# Patient Record
Sex: Female | Born: 1995 | Race: White | Hispanic: No | Marital: Single | Smoking: Never smoker
Health system: Southern US, Community
[De-identification: ages and names within clinical notes are randomized; demographics above are authoritative.]

## PROBLEM LIST (undated history)

## (undated) DIAGNOSIS — L409 Psoriasis, unspecified: Secondary | ICD-10-CM

---

## 2018-09-06 ENCOUNTER — Emergency Department
Admission: EM | Admit: 2018-09-06 | Discharge: 2018-09-06 | Disposition: A | Discharge status: Home / Self Care | Payer: BLUE CROSS/BLUE SHIELD | Source: Home / Self Care | Attending: Family Medicine | Admitting: Family Medicine

## 2018-09-06 ENCOUNTER — Encounter: Payer: Self-pay | Admitting: Emergency Medicine

## 2018-09-06 ENCOUNTER — Emergency Department (INDEPENDENT_AMBULATORY_CARE_PROVIDER_SITE_OTHER): Payer: BLUE CROSS/BLUE SHIELD

## 2018-09-06 ENCOUNTER — Other Ambulatory Visit (HOSPITAL_COMMUNITY)
Admission: RE | Admit: 2018-09-06 | Discharge: 2018-09-06 | Disposition: A | Discharge status: Home / Self Care | Payer: BLUE CROSS/BLUE SHIELD | Source: Ambulatory Visit | Attending: Family Medicine | Admitting: Family Medicine

## 2018-09-06 DIAGNOSIS — R1030 Lower abdominal pain, unspecified: Secondary | ICD-10-CM | POA: Insufficient documentation

## 2018-09-06 DIAGNOSIS — R1031 Right lower quadrant pain: Secondary | ICD-10-CM

## 2018-09-06 DIAGNOSIS — N9489 Other specified conditions associated with female genital organs and menstrual cycle: Secondary | ICD-10-CM

## 2018-09-06 HISTORY — DX: Psoriasis, unspecified: L40.9

## 2018-09-06 LAB — POCT URINALYSIS DIP (MANUAL ENTRY)
Bilirubin, UA: NEGATIVE
Glucose, UA: NEGATIVE mg/dL
Leukocytes, UA: NEGATIVE
Nitrite, UA: NEGATIVE
Protein Ur, POC: NEGATIVE mg/dL
Spec Grav, UA: 1.02 (ref 1.010–1.025)
Urobilinogen, UA: 0.2 E.U./dL
pH, UA: 7 (ref 5.0–8.0)

## 2018-09-06 LAB — POCT CBC W AUTO DIFF (K'VILLE URGENT CARE)

## 2018-09-06 LAB — POCT URINE PREGNANCY: Preg Test, Ur: NEGATIVE

## 2018-09-06 MED ORDER — IOPAMIDOL (ISOVUE-300) INJECTION 61%
30.0000 mL | Freq: Once | INTRAVENOUS | Status: AC | PRN
  Administered 2018-09-06: 15 mL via ORAL

## 2018-09-06 MED ORDER — CEFTRIAXONE SODIUM 1 G IJ SOLR
1.0000 g | Freq: Once | INTRAMUSCULAR | Status: AC
  Administered 2018-09-06: 1 g via INTRAMUSCULAR

## 2018-09-06 MED ORDER — IOPAMIDOL (ISOVUE-300) INJECTION 61%
100.0000 mL | Freq: Once | INTRAVENOUS | Status: AC | PRN
  Administered 2018-09-06: 100 mL via INTRAVENOUS

## 2018-09-06 MED ORDER — KETOROLAC TROMETHAMINE 60 MG/2ML IM SOLN
60.0000 mg | Freq: Once | INTRAMUSCULAR | Status: AC
  Administered 2018-09-06: 60 mg via INTRAMUSCULAR

## 2018-09-06 MED ORDER — DOXYCYCLINE HYCLATE 100 MG PO CAPS: 100.0000 mg | ORAL_CAPSULE | Freq: Two times a day (BID) | ORAL | 0 refills | Status: DC

## 2018-09-06 MED ORDER — TRAMADOL HCL 50 MG PO TABS: 50.0000 mg | ORAL_TABLET | Freq: Four times a day (QID) | ORAL | 0 refills | Status: DC | PRN

## 2018-09-06 NOTE — ED Provider Notes (Signed)
[facility]    CSN: [id number] Arrival date & time: 09/06/18  1218     History   Chief Complaint Chief Complaint  Patient presents with  . Abdominal Pain    HPI Rose Webb is a 23 y.o. female.   HPI  Rose Webb is a 22 y.o. female presenting to UC with c/o sudden onset lower abdominal pain that is constant, worse with movement, radiating to her back since last night. Pain is now radiating throughout her abdomen.  Dysuria. Pt vomited once last night due to the pain.  She has taken ibuprofen w/o relief.  Denies nausea or diarrhea. Denies hx of UTIs. No hx of abdominal surgeries. No hx of similar pain. She is currently on her menstrual cycle. Her cycle is irregular but denies concern for pregnancy and denies pain with her cycles. No hx of cysts or fibroids. Denies concern for STDs. Denies vaginal symptoms.    Past Medical History:  Diagnosis Date  . Psoriasis     There are no active problems to display for this patient.   History reviewed. No pertinent surgical history.  OB History   None      Home Medications    Prior to Admission medications   Medication Sig Start Date End Date Taking? Authorizing Provider  doxycycline (VIBRAMYCIN) 100 MG capsule Take 1 capsule (100 mg total) by mouth 2 (two) times daily. One po bid x 7 days 09/06/18   Lurene Shadow, PA-C  traMADol (ULTRAM) 50 MG tablet Take 1 tablet (50 mg total) by mouth every 6 (six) hours as needed. 09/06/18   Lurene Shadow, PA-C    Family History History reviewed. No pertinent family history.  Social History Social History   Tobacco Use  . Smoking status: Never Smoker  . Smokeless tobacco: Never Used  Substance Use Topics  . Alcohol use: Yes  . Drug use: Not on file     Allergies   Patient has no allergy information on record.   Review of Systems Review of Systems  Constitutional: Positive for appetite change. Negative for chills, fatigue and fever.  Gastrointestinal:  Positive for abdominal pain and vomiting. Negative for diarrhea and nausea.  Genitourinary: Positive for dysuria. Negative for flank pain, frequency, hematuria and urgency.  Musculoskeletal: Positive for back pain. Negative for myalgias.  Neurological: Negative for dizziness, light-headedness and headaches.     Physical Exam Triage Vital Signs ED Triage Vitals [09/06/18 1255]  Enc Vitals Group     BP 124/82     Pulse Rate (!) 120     Resp      Temp 98.4 F (36.9 C)     Temp Source Oral     SpO2 100 %     Weight      Height      Head Circumference      Peak Flow      Pain Score      Pain Loc      Pain Edu?      Excl. in GC?    No data found.  Updated Vital Signs BP 124/82 (BP Location: Right Arm)   Pulse 89   Temp 98.4 F (36.9 C) (Oral)   Wt 187 lb (84.8 kg)   LMP 09/04/2018 Comment: Negative U-preg Today  SpO2 100%   Visual Acuity Right Eye Distance:   Left Eye Distance:   Bilateral Distance:    Right Eye Near:   Left Eye Near:    Bilateral Near:  Physical Exam  Constitutional: She is oriented to person, place, and time. She appears well-developed and well-nourished.  Pt sitting on exam bed, appears uncomfortable, tearful but cooperative during exam.  HENT:  Head: Normocephalic and atraumatic.  Mouth/Throat: Oropharynx is clear and moist.  Eyes: EOM are normal.  Neck: Normal range of motion.  Cardiovascular: Tachycardia present.  Pulmonary/Chest: Effort normal and breath sounds normal.  Abdominal: Soft. Normal appearance. She exhibits no distension. There is generalized tenderness and tenderness in the right lower quadrant, periumbilical area, suprapubic area and left lower quadrant. There is no CVA tenderness.  Musculoskeletal: Normal range of motion.  Neurological: She is alert and oriented to person, place, and time.  Skin: Skin is warm and dry.  Psychiatric: She has a normal mood and affect. Her behavior is normal.  Nursing note and vitals  reviewed.    UC Treatments / Results  Labs (all labs ordered are listed, but only abnormal results are displayed) Labs Reviewed  POCT URINALYSIS DIP (MANUAL ENTRY) - Abnormal; Notable for the following components:      Result Value   Ketones, POC UA trace (5) (*)    Blood, UA small (*)    All other components within normal limits  COMPLETE METABOLIC PANEL WITH GFR  POCT CBC W AUTO DIFF ([facility])  POCT URINE PREGNANCY  CERVICOVAGINAL ANCILLARY ONLY    EKG None  Radiology Ct Abdomen Pelvis W Contrast  Result Date: 09/06/2018 CLINICAL DATA:  Lower abdominal pain, nausea and diarrhea beginning last night. EXAM: CT ABDOMEN AND PELVIS WITH CONTRAST TECHNIQUE: Multidetector CT imaging of the abdomen and pelvis was performed using the standard protocol following bolus administration of intravenous contrast. CONTRAST:  ISOVUE-300 IOPAMIDOL (ISOVUE-300) INJECTION 61% COMPARISON:  None. FINDINGS: LOWER CHEST: Lung bases are clear. Included heart size is normal. No pericardial effusion. HEPATOBILIARY: Liver and gallbladder are normal. Mild focal fatty infiltration about the falciform ligament. PANCREAS: Normal. SPLEEN: Normal. ADRENALS/URINARY TRACT: Kidneys are orthotopic, demonstrating symmetric enhancement. No nephrolithiasis, hydronephrosis or solid renal masses. The unopacified ureters are normal in course and caliber. Urinary bladder is partially distended and unremarkable. Normal adrenal glands. STOMACH/BOWEL: The stomach, small and large bowel are normal in course and caliber without inflammatory changes. Appendix: Location: RIGHT lower quadrant Diameter: 9 mm Appendicolith: Not present Mucosal hyper-enhancement: Not present Extraluminal gas: Not present Periappendiceal collection: Not present VASCULAR/LYMPHATIC: Aortoiliac vessels are normal in course and caliber. No lymphadenopathy by CT size criteria. REPRODUCTIVE: Normal. OTHER: Trace free fluid in anterior pelvic soft  tissues with inflammatory changes about the RIGHT adnexa, discrete from the appendix. MUSCULOSKELETAL: Minimal subcutaneous gas and fat stranding LEFT gluteal subcutaneous fat, potential injection site. IMPRESSION: 1. Enlarged appendix without secondary findings of acute appendicitis. 2. Inflammatory changes in the RIGHT lower quadrant, findings concerning for PID or other gynecologic etiology. Consider pelvic ultrasound. 3. Acute findings discussed with and reconfirmed by Idamae Coccia on 09/06/2018 at 2:46 pm. Electronically Signed   By: Awilda Metro M.D.   On: 09/06/2018 14:48   US Pelvic Complete With Transvaginal  Result Date: 09/06/2018 CLINICAL DATA:  Acute right lower quadrant abdominal pain. EXAM: TRANSABDOMINAL AND TRANSVAGINAL ULTRASOUND OF PELVIS TECHNIQUE: Both transabdominal and transvaginal ultrasound examinations of the pelvis were performed. Transabdominal technique was performed for global imaging of the pelvis including uterus, ovaries, adnexal regions, and pelvic cul-de-sac. It was necessary to proceed with endovaginal exam following the transabdominal exam to visualize the endometrium and ovaries. COMPARISON:  None FINDINGS: Uterus Measurements: 6.8 x  4.5 x 3.4 cm. No fibroids or other mass visualized. Endometrium Thickness: 10 mm which is within normal limits. No focal abnormality visualized. Right ovary Measurements: 2.8 x 2.4 x 2.2 cm. Normal appearance/no adnexal mass. Left ovary Measurements: 3.4 x 2.4 x 2.4 cm. Normal appearance/no adnexal mass. Other findings Minimal free fluid is noted which most likely is physiologic. Enlarged varices are noted in both adnexal regions suggesting pelvic congestion syndrome. IMPRESSION: Enlarged varices are noted in both adnexal regions suggesting pelvic congestion syndrome. Uterus and ovaries are otherwise unremarkable. Electronically Signed   By: Lupita Raider, M.D.   On: 09/06/2018 15:52    Procedures Procedures (including critical care  time)  Medications Ordered in UC Medications  ketorolac (TORADOL) injection 60 mg (60 mg Intramuscular Given 09/06/18 1305)  cefTRIAXone (ROCEPHIN) injection 1 g (1 g Intramuscular Given 09/06/18 1644)    Initial Impression / Assessment and Plan / UC Course  I have reviewed the triage vital signs and the nursing notes.  Pertinent labs & imaging results that were available during my care of the patient were reviewed by me and considered in my medical decision making (see chart for details).     Diffuse abdominal pain, worse in mid to lower abdomen. CBC- WBC: 16.5, pt appears uncomfortable, tearful and tachycardic. UA: unremarkable Urine preg: negative  Toradol 60mg  IM given for pain Concern for possible appendicitis, vs cholecystitis, diverticulitis, ovarian cysts or fibroid CT abd ordered.   CT results discussed with Dr. Karie Kirks, radiology.  Discussed plan with pt.  Pelvic ultrasound ordered as recommended.   [place] suggestive of pelvic congestion syndrome. Consulted with  [facility] and Dr. Cathren Harsh. Will tx for possible PID with Rocephin 1g IM and doxycycline Encourage f/u tomorrow in UC for recheck of symptoms as pt may have early appendicitis.  Discussed symptoms that warrant emergent care in the ED.    Final Clinical Impressions(s) / UC Diagnoses   Final diagnoses:  Lower abdominal pain  Female pelvic congestion syndrome     Discharge Instructions      Your workup today is concerning for possible early appendicitis vs pelvic inflammatory disease which could be due to a bacterial infection in your uterus.  You were given a shot of antibiotics today as well as prescribed some oral antibiotics to take for 1 week.   It is strongly recommended that you return tomorrow to urgent care or your family doctor in the morning for a recheck of your symptoms. If pain continues to worsen, you develop a fever, nausea/vomiting, please go to the emergency department this evening for  further evaluation/monitoring and possible surgical consult for possible appendicitis.    In the meantime, try to stay well hydrated with clear fluids and stick with a bland diet until you are feeling better.   Tramadol is strong pain medication. While taking, do not drink alcohol, drive, or perform any other activities that requires focus while taking these medications.      ED Prescriptions    Medication Sig Dispense Auth. Provider   doxycycline (VIBRAMYCIN) 100 MG capsule Take 1 capsule (100 mg total) by mouth 2 (two) times daily. One po bid x 7 days 14 capsule Puneet Masoner O, PA-C   traMADol (ULTRAM) 50 MG tablet Take 1 tablet (50 mg total) by mouth every 6 (six) hours as needed. 15 tablet Lurene Shadow, PA-C     Controlled Substance Prescriptions [place] Controlled Substance Registry consulted? Yes, I have consulted the [place] Controlled Substances Registry for  this patient, and feel the risk/benefit ratio today is favorable for proceeding with this prescription for a controlled substance.   Lurene Shadow, [place] 09/06/18 [phone]

## 2018-09-06 NOTE — Discharge Instructions (Signed)
°  Your workup today is concerning for possible early appendicitis vs pelvic inflammatory disease which could be due to a bacterial infection in your uterus.  You were given a shot of antibiotics today as well as prescribed some oral antibiotics to take for 1 week.   It is strongly recommended that you return tomorrow to urgent care or your family doctor in the morning for a recheck of your symptoms. If pain continues to worsen, you develop a fever, nausea/vomiting, please go to the emergency department this evening for further evaluation/monitoring and possible surgical consult for possible appendicitis.    In the meantime, try to stay well hydrated with clear fluids and stick with a bland diet until you are feeling better.   Tramadol is strong pain medication. While taking, do not drink alcohol, drive, or perform any other activities that requires focus while taking these medications.

## 2018-09-06 NOTE — ED Triage Notes (Signed)
Pt c/o abdominal pain that radiates to her lower back. Started suddenly last night. States pain is sharp and constant and worse with movement.

## 2018-09-07 ENCOUNTER — Telehealth: Payer: Self-pay | Admitting: *Deleted

## 2018-09-07 LAB — CERVICOVAGINAL ANCILLARY ONLY
Bacterial vaginitis: NEGATIVE
Candida vaginitis: NEGATIVE
Chlamydia: NEGATIVE
Neisseria Gonorrhea: NEGATIVE
Trichomonas: NEGATIVE

## 2018-09-07 LAB — COMPLETE METABOLIC PANEL WITH GFR
AG Ratio: 1.2 (calc) (ref 1.0–2.5)
ALT: 15 U/L (ref 6–29)
AST: 17 U/L (ref 10–30)
Albumin: 4.2 g/dL (ref 3.6–5.1)
Alkaline phosphatase (APISO): 67 U/L (ref 33–115)
BUN: 8 mg/dL (ref 7–25)
CO2: 24 mmol/L (ref 20–32)
Calcium: 9.3 mg/dL (ref 8.6–10.2)
Chloride: 102 mmol/L (ref 98–110)
Creat: 0.84 mg/dL (ref 0.50–1.10)
GFR, Est African American: 114 mL/min/{1.73_m2} (ref >=60)
GFR, Est Non African American: 99 mL/min/{1.73_m2} (ref >=60)
Globulin: 3.4 g/dL (calc) (ref 1.9–3.7)
Glucose, Bld: 96 mg/dL (ref 65–99)
Potassium: 3.9 mmol/L (ref 3.5–5.3)
Sodium: 136 mmol/L (ref 135–146)
Total Bilirubin: 0.4 mg/dL (ref 0.2–1.2)
Total Protein: 7.6 g/dL (ref 6.1–8.1)

## 2018-09-07 NOTE — Telephone Encounter (Signed)
Spoke to pt given lab results. Call back if you have any questions or concerns. Clemens Catholic, LPN

## 2019-03-19 IMAGING — US US PELVIS COMPLETE TRANSABD/TRANSVAG
1 series · 13 of 25 positions shown · non-contrast
Comparison: None

CLINICAL DATA: Acute right lower quadrant abdominal pain.

EXAM:
TRANSABDOMINAL AND TRANSVAGINAL ULTRASOUND OF PELVIS
TECHNIQUE: Both transabdominal and transvaginal ultrasound examinations of the
pelvis were performed. Transabdominal technique was performed for
global imaging of the pelvis including uterus, ovaries, adnexal
regions, and pelvic cul-de-sac. It was necessary to proceed with
endovaginal exam following the transabdominal exam to visualize the
endometrium and ovaries.

[Series 1: us pelvis complete transabd/transvag · 0.17mm/px · 13 of 71 slices shown]
[im 1/71]
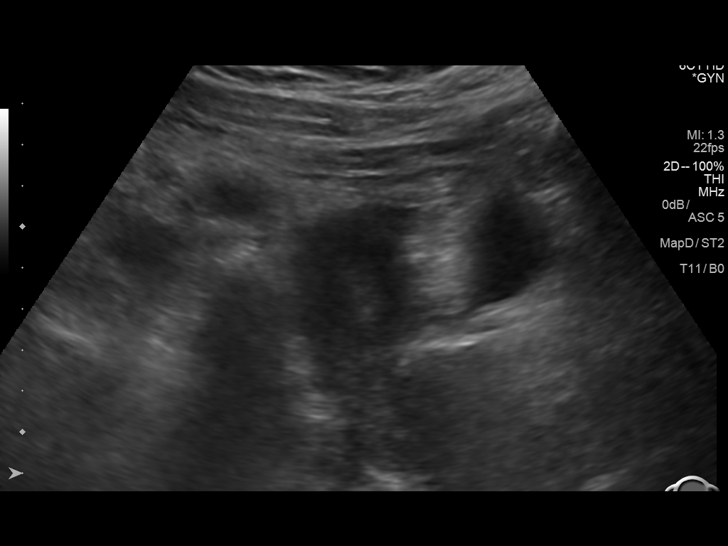
[im 6/71]
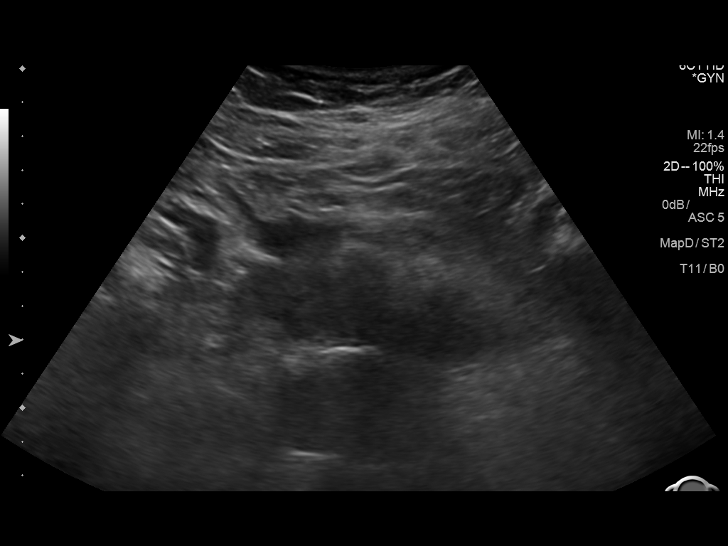
[im 12/71]
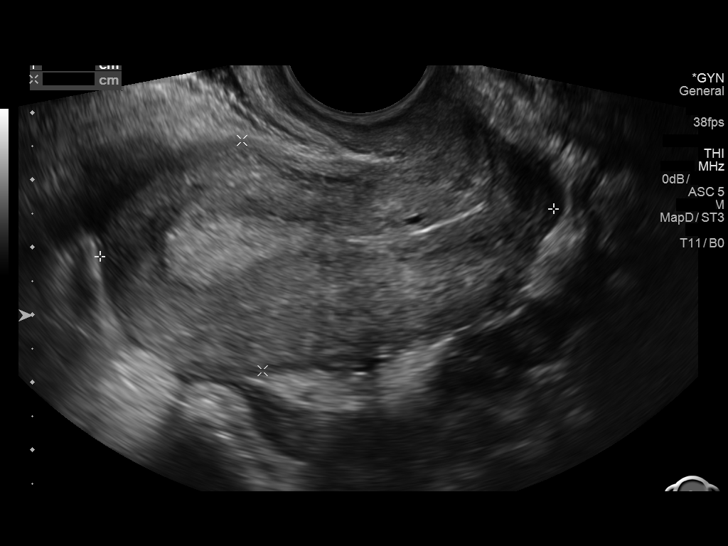
[im 18/71]
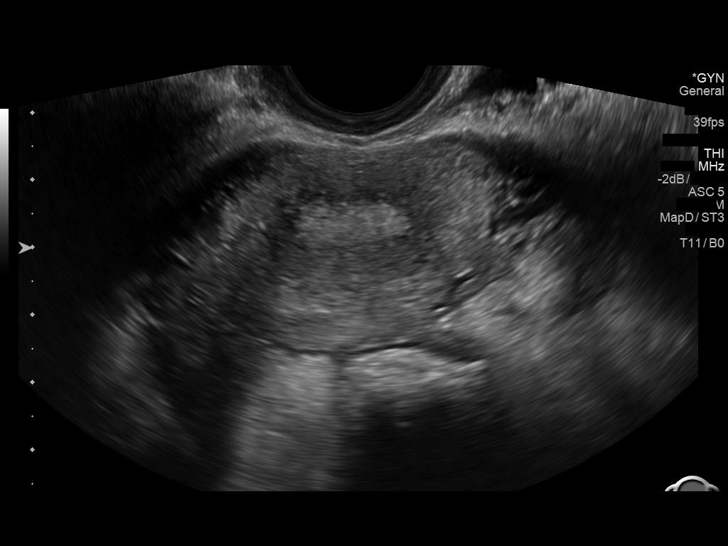
[im 24/71]
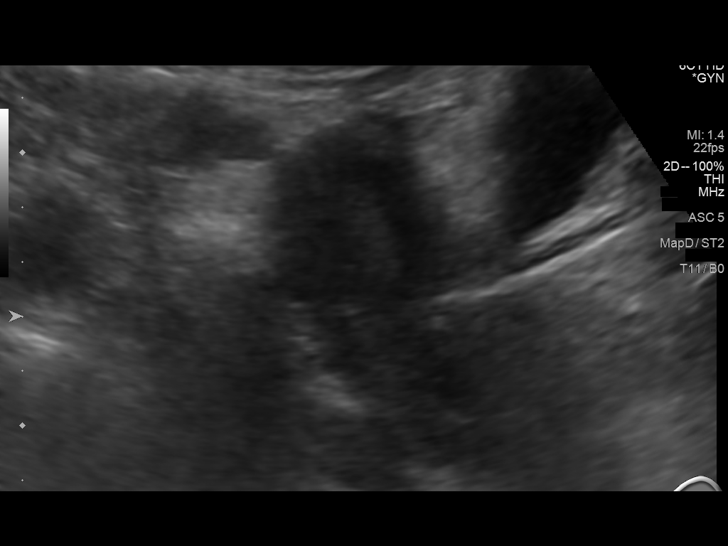
[im 30/71]
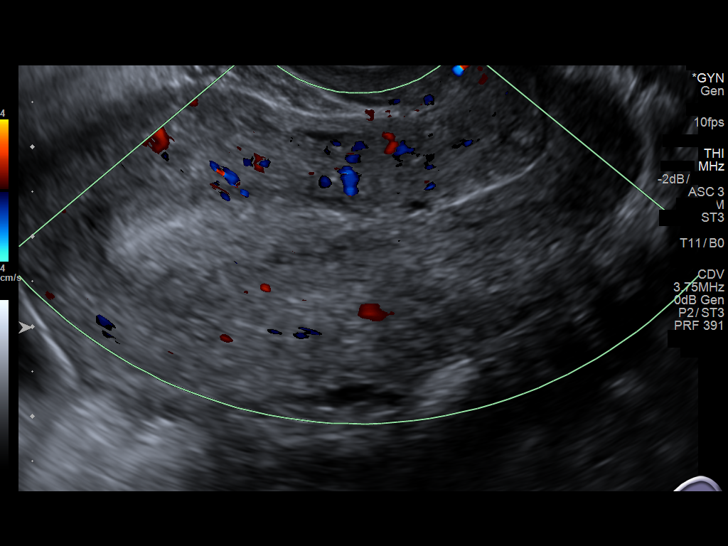
[im 36/71]
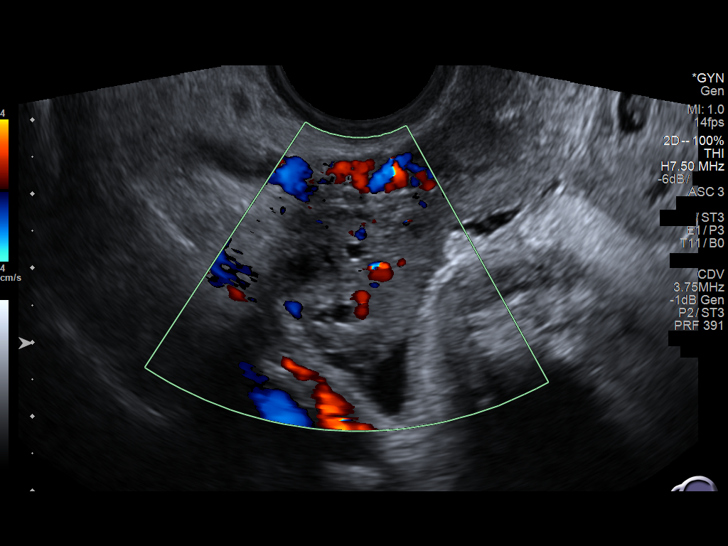
[im 41/71]
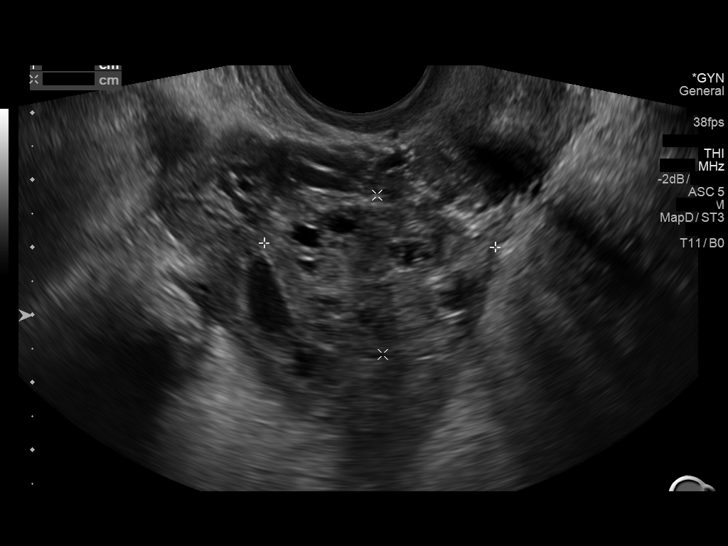
[im 47/71]
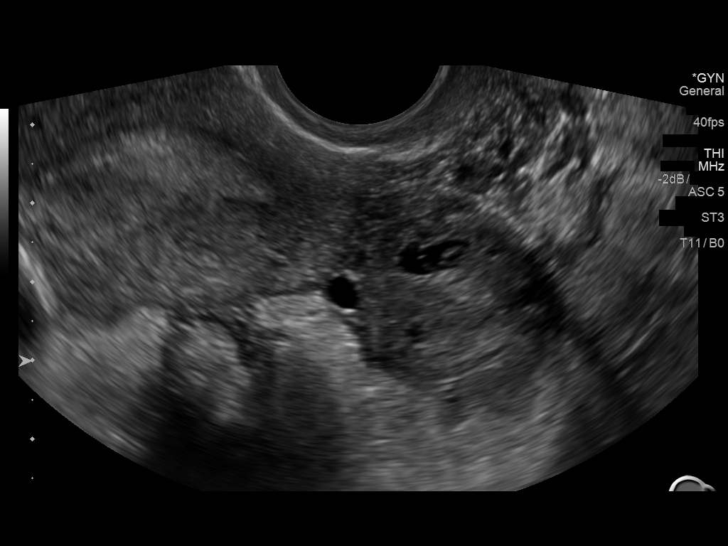
[im 53/71]
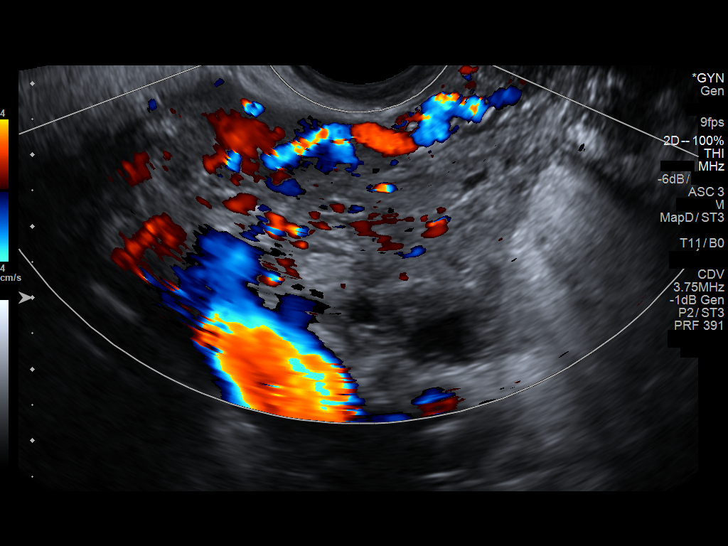
[im 59/71]
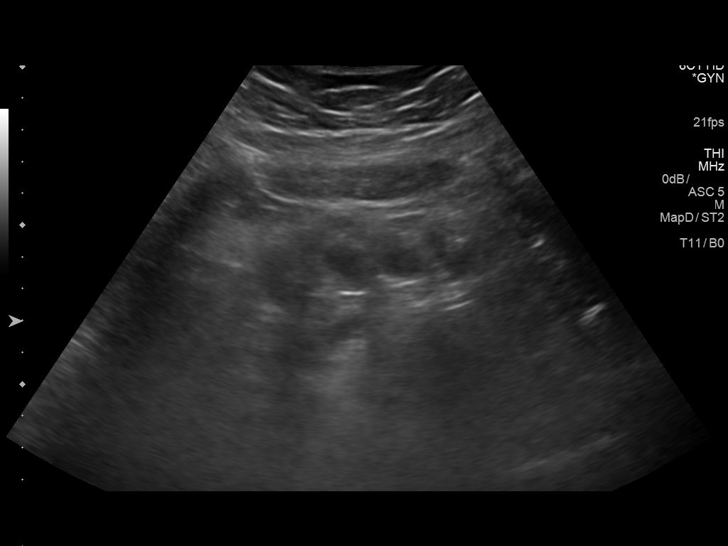
[im 65/71]
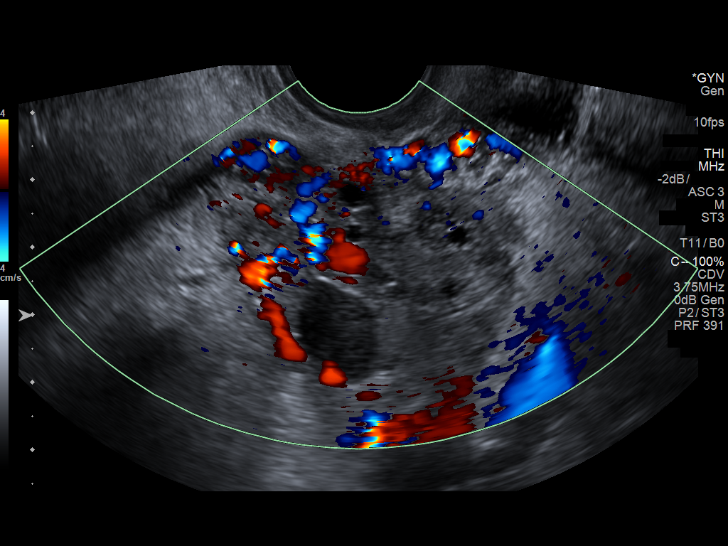
[im 71/71]
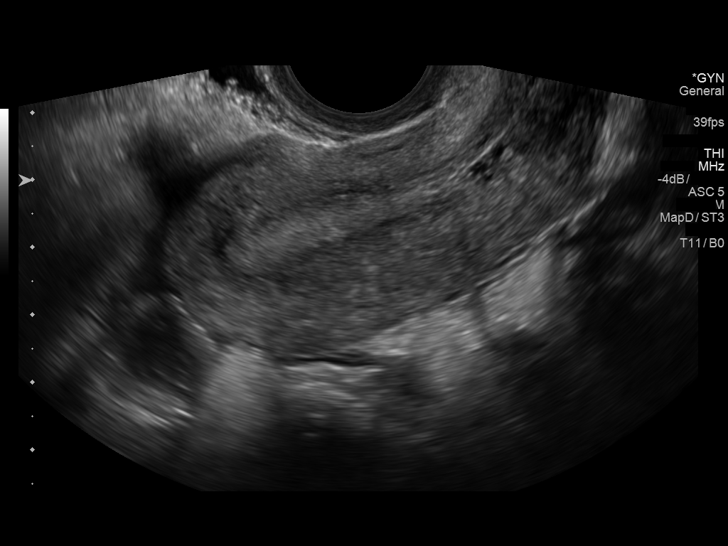

[13 of 25 positions shown; findings below may reference images not displayed]

FINDINGS: Uterus

Measurements: 6.8 x 4.5 x 3.4 cm. No fibroids or other mass
visualized.

Endometrium

Thickness: 10 mm which is within normal limits. No focal abnormality
visualized.

Right ovary

Measurements: 2.8 x 2.4 x 2.2 cm. Normal appearance/no adnexal mass.

Left ovary

Measurements: 3.4 x 2.4 x 2.4 cm. Normal appearance/no adnexal mass.

Other findings

Minimal free fluid is noted which most likely is physiologic.
Enlarged varices are noted in both adnexal regions suggesting pelvic
congestion syndrome.
IMPRESSION: Enlarged varices are noted in both adnexal regions suggesting pelvic
congestion syndrome. Uterus and ovaries are otherwise unremarkable.

## 2019-12-12 ENCOUNTER — Emergency Department (HOSPITAL_BASED_OUTPATIENT_CLINIC_OR_DEPARTMENT_OTHER)
Admission: EM | Admit: 2019-12-12 | Discharge: 2019-12-12 | Disposition: A | Discharge status: Home / Self Care | Payer: BC Managed Care – PPO | Attending: Emergency Medicine | Admitting: Emergency Medicine

## 2019-12-12 ENCOUNTER — Telehealth: Payer: Self-pay

## 2019-12-12 ENCOUNTER — Other Ambulatory Visit: Payer: Self-pay | Admitting: Obstetrics and Gynecology

## 2019-12-12 ENCOUNTER — Other Ambulatory Visit: Payer: Self-pay

## 2019-12-12 ENCOUNTER — Emergency Department (HOSPITAL_COMMUNITY): Payer: BC Managed Care – PPO

## 2019-12-12 ENCOUNTER — Emergency Department (HOSPITAL_BASED_OUTPATIENT_CLINIC_OR_DEPARTMENT_OTHER): Payer: BC Managed Care – PPO

## 2019-12-12 ENCOUNTER — Encounter (HOSPITAL_BASED_OUTPATIENT_CLINIC_OR_DEPARTMENT_OTHER): Payer: Self-pay | Admitting: Oncology

## 2019-12-12 DIAGNOSIS — Z20822 Contact with and (suspected) exposure to covid-19: Secondary | ICD-10-CM | POA: Diagnosis not present

## 2019-12-12 DIAGNOSIS — R102 Pelvic and perineal pain: Secondary | ICD-10-CM | POA: Diagnosis not present

## 2019-12-12 DIAGNOSIS — R000 Tachycardia, unspecified: Secondary | ICD-10-CM | POA: Diagnosis not present

## 2019-12-12 DIAGNOSIS — N83209 Unspecified ovarian cyst, unspecified side: Secondary | ICD-10-CM

## 2019-12-12 LAB — CBC WITH DIFFERENTIAL/PLATELET
Abs Immature Granulocytes: 0.13 10*3/uL — ABNORMAL HIGH (ref 0.00–0.07)
Basophils Absolute: 0 10*3/uL (ref 0.0–0.1)
Basophils Relative: 0 %
Eosinophils Absolute: 0.1 10*3/uL (ref 0.0–0.5)
Eosinophils Relative: 1 %
HCT: 41.9 % (ref 36.0–46.0)
Hemoglobin: 13.2 g/dL (ref 12.0–15.0)
Immature Granulocytes: 1 %
Lymphocytes Relative: 8 %
Lymphs Abs: 1.5 10*3/uL (ref 0.7–4.0)
MCH: 28.3 pg (ref 26.0–34.0)
MCHC: 31.5 g/dL (ref 30.0–36.0)
MCV: 89.7 fL (ref 80.0–100.0)
Monocytes Absolute: 1.1 10*3/uL — ABNORMAL HIGH (ref 0.1–1.0)
Monocytes Relative: 6 %
Neutro Abs: 15.8 10*3/uL — ABNORMAL HIGH (ref 1.7–7.7)
Neutrophils Relative %: 84 %
Platelets: 204 10*3/uL (ref 150–400)
RBC: 4.67 MIL/uL (ref 3.87–5.11)
RDW: 13.4 % (ref 11.5–15.5)
WBC: 18.6 10*3/uL — ABNORMAL HIGH (ref 4.0–10.5)
nRBC: 0 % (ref 0.0–0.2)

## 2019-12-12 LAB — URINALYSIS, ROUTINE W REFLEX MICROSCOPIC
Bilirubin Urine: NEGATIVE
Glucose, UA: NEGATIVE mg/dL
Ketones, ur: 40 mg/dL — AB
Leukocytes,Ua: NEGATIVE
Nitrite: NEGATIVE
Protein, ur: NEGATIVE mg/dL
Specific Gravity, Urine: 1.015 (ref 1.005–1.030)
pH: 6 (ref 5.0–8.0)

## 2019-12-12 LAB — SARS CORONAVIRUS 2 BY RT PCR (HOSPITAL ORDER, PERFORMED IN ~~LOC~~ HOSPITAL LAB): SARS Coronavirus 2: NEGATIVE

## 2019-12-12 LAB — BASIC METABOLIC PANEL
Anion gap: 9 (ref 5–15)
BUN: 9 mg/dL (ref 6–20)
CO2: 21 mmol/L — ABNORMAL LOW (ref 22–32)
Calcium: 8.7 mg/dL — ABNORMAL LOW (ref 8.9–10.3)
Chloride: 106 mmol/L (ref 98–111)
Creatinine, Ser: 0.72 mg/dL (ref 0.44–1.00)
GFR calc Af Amer: >60 mL/min (ref >60)
GFR calc non Af Amer: >60 mL/min (ref >60)
Glucose, Bld: 121 mg/dL — ABNORMAL HIGH (ref 70–99)
Potassium: 3.4 mmol/L — ABNORMAL LOW (ref 3.5–5.1)
Sodium: 136 mmol/L (ref 135–145)

## 2019-12-12 LAB — URINALYSIS, MICROSCOPIC (REFLEX): Bacteria, UA: NONE SEEN

## 2019-12-12 LAB — HCG, QUANTITATIVE, PREGNANCY: hCG, Beta Chain, Quant, S: <1 m[IU]/mL (ref <5)

## 2019-12-12 MED ORDER — SODIUM CHLORIDE 0.9 % IV BOLUS (SEPSIS)
1000.0000 mL | Freq: Once | INTRAVENOUS | Status: AC
  Administered 2019-12-12: 03:00:00 1000 mL via INTRAVENOUS

## 2019-12-12 MED ORDER — OXYCODONE HCL 5 MG PO TABS: 2.5000 mg | ORAL_TABLET | Freq: Four times a day (QID) | ORAL | 0 refills | Status: AC | PRN

## 2019-12-12 MED ORDER — FENTANYL CITRATE (PF) 100 MCG/2ML IJ SOLN
100.0000 ug | Freq: Once | INTRAMUSCULAR | Status: AC
  Administered 2019-12-12: 100 ug via INTRAVENOUS
  Filled 2019-12-12: qty 2

## 2019-12-12 MED ORDER — MORPHINE SULFATE (PF) 4 MG/ML IV SOLN
4.0000 mg | Freq: Once | INTRAVENOUS | Status: AC
  Administered 2019-12-12: 4 mg via INTRAVENOUS
  Filled 2019-12-12: qty 1

## 2019-12-12 MED ORDER — IBUPROFEN 800 MG PO TABS: 800.0000 mg | ORAL_TABLET | Freq: Three times a day (TID) | ORAL | 0 refills | Status: AC

## 2019-12-12 MED ORDER — SODIUM CHLORIDE 0.9 % IV BOLUS (SEPSIS)
1000.0000 mL | Freq: Once | INTRAVENOUS | Status: AC
  Administered 2019-12-12: 01:00:00 1000 mL via INTRAVENOUS

## 2019-12-12 MED ORDER — SODIUM CHLORIDE 0.9 % IV BOLUS (SEPSIS)
1000.0000 mL | Freq: Once | INTRAVENOUS | Status: AC
  Administered 2019-12-12: 1000 mL via INTRAVENOUS

## 2019-12-12 MED ORDER — IOHEXOL 300 MG/ML  SOLN
100.0000 mL | Freq: Once | INTRAMUSCULAR | Status: AC | PRN
  Administered 2019-12-12: 100 mL via INTRAVENOUS

## 2019-12-12 MED ORDER — FENTANYL CITRATE (PF) 100 MCG/2ML IJ SOLN
50.0000 ug | Freq: Once | INTRAMUSCULAR | Status: AC
  Administered 2019-12-12: 50 ug via INTRAVENOUS
  Filled 2019-12-12: qty 2

## 2019-12-12 MED ORDER — SODIUM CHLORIDE 0.9 % IV BOLUS: 1000.0000 mL | Freq: Once | INTRAVENOUS | Status: DC

## 2019-12-12 NOTE — ED Notes (Addendum)
Pt brought in to ED RM 22 by [facility]. from [facility]. Pt A&Ox4, in NAD. VSS. Pt c/o R suprapubic abd pain that began yesterday and worsened over the night. Pain radiates to back. Endorses burning with urination. Denies vaginal bleeding/hematuria. Pt had CT done at [facility] and sent over for pelvic ultrasound. Pt seen by PA at bedside

## 2019-12-12 NOTE — ED Provider Notes (Signed)
Pt awake/alert, resting comfortably, awaiting transfer to [facility] for emergent pelvic ultrasound to evaluate for ovarian torsion/infectious etiology  The patient appears reasonably stabilized for transfer considering the current resources, flow, and capabilities available in the ED at this time, and I doubt any other [facility] requiring further screening and/or treatment in the ED prior to transfer.    Zadie Rhine, MD 12/12/19 [phone]

## 2019-12-12 NOTE — ED Notes (Signed)
ED Provider at bedside. 

## 2019-12-12 NOTE — Telephone Encounter (Signed)
-----   Message from Catalina Antigua, MD sent at 12/12/2019  8:35 AM EST ----- Please schedule pelvic ultrasound to follow up ovarian cyst in 6-12 weeks (order in Epic) and schedule GYN appointment soon after ultrasound to discuss results and further management if indicated. This can be a virtual appointment if convenient for the patient  Thanks  Gigi Gin

## 2019-12-12 NOTE — Telephone Encounter (Signed)
Called pt to advise that U/S has been scheduled for 01/24/20 at 10 am, pt to arrive at 9:45am w/ full bladder,no answer, left VM.

## 2019-12-12 NOTE — ED Notes (Signed)
Pt back from [place] without incidence in NAD

## 2019-12-12 NOTE — ED Notes (Signed)
Morphine given per MAR. Name/DOB verified with pt. Pt taken to [place] in NAD

## 2019-12-12 NOTE — ED Notes (Signed)
Care endorsed to Cindy, RN 

## 2019-12-12 NOTE — ED Notes (Signed)
Report given to Barb with [facility] 

## 2019-12-12 NOTE — ED Provider Notes (Signed)
[facility] EMERGENCY DEPARTMENT Provider Note   CSN: [id number] Arrival date & time: 12/12/19  0024     History Chief Complaint  Patient presents with  . Abdominal Pain    Rose Webb is a 24 y.o. female.  The history is provided by the patient.  Abdominal Pain Pain location:  RLQ Pain quality: aching   Pain radiates to:  Back Pain severity:  Moderate Onset quality:  Gradual Duration:  1 day Timing:  Intermittent Progression:  Worsening Chronicity:  New Relieved by:  Nothing Worsened by:  Movement, palpation and position changes Associated symptoms: dysuria, nausea, vaginal bleeding and vomiting   Associated symptoms: no chest pain and no fever   Patient reports onset of lower abdominal pain and back pain for the past day.  She reports it started gradually in her right lower quadrant and now involves her back.  She reports it is getting worse.  She reports it worsens with movement and while driving in the car.  She reports vomiting.  She reports she is having vaginal bleeding but is on her menstrual cycle She also reports dysuria She had an episode of pain in 2019 that required CT imaging as well as ultrasound imaging.  She reports this pain episodes is much worse     Past Medical History:  Diagnosis Date  . Psoriasis     There are no problems to display for this patient.   History reviewed. No pertinent surgical history.   OB History   No obstetric history on file.     No family history on file.  Social History   Tobacco Use  . Smoking status: Never Smoker  . Smokeless tobacco: Never Used  Substance Use Topics  . Alcohol use: Yes  . Drug use: Not Currently    Home Medications Prior to Admission medications   Not on File    Allergies    Patient has no known allergies.  Review of Systems   Review of Systems  Constitutional: Negative for fever.  Cardiovascular: Negative for chest pain.  Gastrointestinal: Positive for abdominal pain,  nausea and vomiting.  Genitourinary: Positive for dysuria and vaginal bleeding.  All other systems reviewed and are negative.   Physical Exam Updated Vital Signs BP 134/82 (BP Location: Right Arm)   Pulse (!) 155   Temp 98.1 F (36.7 C) (Oral)   Resp (!) 22   Ht 1.524 m (5')   Wt 93 kg   LMP 12/09/2019 (Exact Date)   SpO2 97%   BMI 40.04 kg/m   Physical Exam CONSTITUTIONAL: Well developed/well nourished, uncomfortable appearing HEAD: Normocephalic/atraumatic EYES: EOMI/PERRL ENMT: Mucous membranes moist NECK: supple no meningeal signs SPINE/BACK:entire spine nontender CV: S1/S2 noted, no murmurs/rubs/gallops noted LUNGS: Lungs are clear to auscultation bilaterally, no apparent distress ABDOMEN: soft, moderate bilateral lower abdominal tenderness, no  guarding, bowel sounds noted throughout abdomen GU:no cva tenderness Pelvic-no CMT.  Moderate right adnexal tenderness without mass.  Mild left adnexal tenderness.  No vaginal discharge or bleeding nurse present for entire exam NEURO: Pt is awake/alert/appropriate, moves all extremitiesx4.  No facial droop.   EXTREMITIES: pulses normal/equal, full ROM SKIN: warm, color normal PSYCH: Anxious  ED Results / Procedures / Treatments   Labs (all labs ordered are listed, but only abnormal results are displayed) Labs Reviewed  URINALYSIS, ROUTINE W REFLEX MICROSCOPIC - Abnormal; Notable for the following components:      Result Value   Hgb urine dipstick TRACE (*)    Ketones, ur  40 (*)    All other components within normal limits  BASIC METABOLIC PANEL - Abnormal; Notable for the following components:   Potassium 3.4 (*)    CO2 21 (*)    Glucose, Bld 121 (*)    Calcium 8.7 (*)    All other components within normal limits  CBC WITH DIFFERENTIAL/PLATELET - Abnormal; Notable for the following components:   WBC 18.6 (*)    Neutro Abs 15.8 (*)    Monocytes Absolute 1.1 (*)    Abs Immature Granulocytes 0.13 (*)    All other  components within normal limits  SARS CORONAVIRUS 2 BY RT PCR (HOSPITAL ORDER, [place] [facility])  HCG, QUANTITATIVE, PREGNANCY  URINALYSIS, MICROSCOPIC (REFLEX)  GC/CHLAMYDIA PROBE AMP ([place]) NOT AT [facility]  GC/CHLAMYDIA PROBE AMP ([place]) NOT AT [facility]    EKG None  Radiology CT ABDOMEN PELVIS W CONTRAST  Result Date: 12/12/2019 CLINICAL DATA:  Right lower quadrant pain nausea and vomiting EXAM: CT ABDOMEN AND PELVIS WITH CONTRAST TECHNIQUE: Multidetector CT imaging of the abdomen and pelvis was performed using the standard protocol following bolus administration of intravenous contrast. CONTRAST:  161mL OMNIPAQUE IOHEXOL 300 MG/ML  SOLN COMPARISON:  09/06/2018 FINDINGS: LOWER CHEST: Normal. HEPATOBILIARY: Normal hepatic contours. No intra- or extrahepatic biliary dilatation. Normal gallbladder. PANCREAS: Normal pancreas. No ductal dilatation or peripancreatic fluid collection. SPLEEN: Normal. ADRENALS/URINARY TRACT: The adrenal glands are normal. No hydronephrosis, nephroureterolithiasis or solid renal mass. The urinary bladder is normal for degree of distention STOMACH/BOWEL: There is no hiatal hernia. Normal duodenal course and caliber. No small bowel dilatation or inflammation. No focal colonic abnormality. Not visualized. No right lower quadrant inflammation or free fluid. VASCULAR/LYMPHATIC: Normal course and caliber of the major abdominal vessels. No abdominal or pelvic lymphadenopathy. REPRODUCTIVE: There are multiple bilateral adnexal cystic structures, measuring up 2 4.7 cm on the right and 4.6 cm on the left. No free fluid in the pelvis. MUSCULOSKELETAL. No bony spinal canal stenosis or focal osseous abnormality. OTHER: None. IMPRESSION: 1. No acute gastrointestinal abnormality. 2. Multiple bilateral adnexal cystic structures, measuring up to 4.7 cm on the right and 4.6 cm on the left. These may be further characterized with pelvic ultrasound. Electronically Signed    By: Ulyses Jarred M.D.   On: 12/12/2019 02:18    Procedures .Critical Care Performed by: Ripley Fraise, MD Authorized by: Ripley Fraise, MD   Critical care provider statement:    Critical care time (minutes):  45   Critical care start time:  12/12/2019 1:00 AM   Critical care end time:  12/12/2019 1:45 AM   Critical care time was exclusive of:  Separately billable procedures and treating other patients   Critical care was necessary to treat or prevent imminent or life-threatening deterioration of the following conditions:  Dehydration and shock   Critical care was time spent personally by me on the following activities:  Ordering and performing treatments and interventions, ordering and review of laboratory studies, ordering and review of radiographic studies, pulse oximetry, re-evaluation of patient's condition, review of old charts, examination of patient, evaluation of patient's response to treatment, discussions with consultants, development of treatment plan with patient or surrogate and obtaining history from patient or surrogate   I assumed direction of critical care for this patient from another provider in my specialty: no      Medications Ordered in ED Medications  sodium chloride 0.9 % bolus 1,000 mL (1,000 mLs Intravenous New Bag/Given 12/12/19 0248)  sodium  chloride 0.9 % bolus 1,000 mL (0 mLs Intravenous Stopped 12/12/19 0131)  fentaNYL (SUBLIMAZE) injection 100 mcg (100 mcg Intravenous Given 12/12/19 0046)  sodium chloride 0.9 % bolus 1,000 mL (0 mLs Intravenous Stopped 12/12/19 0144)  fentaNYL (SUBLIMAZE) injection 100 mcg (100 mcg Intravenous Given 12/12/19 0141)  iohexol (OMNIPAQUE) 300 MG/ML solution 100 mL (100 mLs Intravenous Contrast Given 12/12/19 0158)  fentaNYL (SUBLIMAZE) injection 100 mcg (100 mcg Intravenous Given 12/12/19 0243)    ED Course  I have reviewed the triage vital signs and the nursing notes.  Pertinent labs & imaging results that were available during my  care of the patient were reviewed by me and considered in my medical decision making (see chart for details).    MDM Rules/Calculators/A&P                      1:33 AM Patient presented for abdominal pain that started over 24 hours ago but is worsening.  After first round of IV pain medications and fluids, she has continued pain.  She has diffuse lower abdominal tenderness. Patient is not pregnant. Patient has leukocytosis of white count up to 18, but hemoglobin is stable at 13.2. Due to significant abdominal tenderness and elevated WBC, will proceed with CT imaging 3:11 AM CT imaging negative for appendicitis but does reveal enlarged adnexal cystic structures I reviewed the CT scan with Dr. Chase Picket radiology.  No signs of appendicitis, but does appear to have  large cystic structure  Discussed the case with Dr. Debroah Loop with OB/GYN. He agrees the patient will likely need emergent pelvic ultrasound as well as Doppler flow to rule out torsion However since she is not pregnant, she cannot be transferred [facility].  Patient should be directed to the main adult ED at [facility]  Discussed with Dr. Blinda Leatherwood in the emergency department at [facility]. Patient be transferred there for emergent ultrasound  Of note, patient reports she has never had a pelvic exam before  she also reports no history of STDs. Final Clinical Impression(s) / ED Diagnoses Final diagnoses:  Pelvic pain  Pelvic pain in female  Tachycardia    Rx / DC Orders ED Discharge Orders    None       Zadie Rhine, MD 12/12/19 [phone]

## 2019-12-12 NOTE — ED Provider Notes (Signed)
Patient here with RLQ pain.  Onset was yesterday.  Has been gradually worsening.  Afebrile. Tachycardic.  Leukocytosis to 18.6.  Seen at [facility] by Dr. Bebe Shaggy, had CT which was negative for appy, but there is concern for ovarian torsion so the patient was sent to [facility] to have [place].  Patient reports some increased pain after the ride over to [facility].  Patient signed out to [place], [place].   Roxy Horseman, PA-C 12/12/19 6789    Gilda Crease, MD 12/12/19 [phone]

## 2019-12-12 NOTE — ED Notes (Signed)
[facility] notified Delice Bison) - patient to be transported to [facility] ED

## 2019-12-12 NOTE — ED Notes (Signed)
Wasted fentanyl in sharps, witnessed by Elvis Coil, RN.

## 2019-12-12 NOTE — Discharge Instructions (Signed)
Get help right away if: You have abdominal pain that is severe or gets worse. You cannot eat or drink without vomiting. You suddenly develop a fever. Your menstrual period is much heavier than usual 

## 2019-12-12 NOTE — ED Triage Notes (Signed)
Pt reports RLQ pain that radiates to her back.  Pain started yesterday.  C/o burning w/ urination.

## 2019-12-12 NOTE — ED Provider Notes (Signed)
7:04 AM Patient taken in sign out from PA browning.  Patient transferred from [facility] RLQ abdominal tenderness. Negative CT- Pelvic Ultrasound is pending.  Physical Exam  BP 113/80   Pulse (!) 125   Temp 98.3 F (36.8 C) (Oral)   Resp 18   Ht 5' (1.524 m)   Wt 93 kg   LMP 12/09/2019 (Exact Date)   SpO2 99%   BMI 40.04 kg/m   7:50 AM I spoke with [place] tech- Pelvic US completed, having technical difficulties- verbally, good blood flow to the ovaries.  Physical Exam Vitals and nursing note reviewed.  Constitutional:      General: She is not in acute distress.    Appearance: She is well-developed. She is not diaphoretic.  HENT:     Head: Normocephalic and atraumatic.  Eyes:     General: No scleral icterus.    Conjunctiva/sclera: Conjunctivae normal.  Cardiovascular:     Rate and Rhythm: Normal rate and regular rhythm.     Heart sounds: Normal heart sounds. No murmur. No friction rub. No gallop.   Pulmonary:     Effort: Pulmonary effort is normal. No respiratory distress.     Breath sounds: Normal breath sounds.  Abdominal:     General: Bowel sounds are normal. There is distension.     Palpations: Abdomen is soft. There is no mass.     Tenderness: There is abdominal tenderness in the suprapubic area and left lower quadrant. There is guarding and rebound.     Comments: Lower abdominal distention and firmness, tenderness, mild rebound.  Musculoskeletal:     Cervical back: Normal range of motion.  Skin:    General: Skin is warm and dry.  Neurological:     Mental Status: She is alert and oriented to person, place, and time.  Psychiatric:        Behavior: Behavior normal.     ED Course/Procedures     Procedures  MDM  8:46 AM BP 100/71   Pulse 98   Temp 98.3 F (36.8 C) (Oral)   Resp (!) 21   Ht 5' (1.524 m)   Wt 93 kg   LMP 12/09/2019 (Exact Date)   SpO2 97%   BMI 40.04 kg/m  Spoke with Dr. Jolayne Panther of the GYN service about the patient's findings.  She and I  both reviewed the CT scans and ultrasound which shows bilateral large complex cystic ovaries.  Right is larger than left with obvious hemorrhagic component and septation.  Patient has peritoneal signs.  Dr. Jolayne Panther suggest pain control, heating pads, and reevaluation in clinic in 1 week to look for resolution of the cyst.  She states that if they are not improving then they may talk about surgical intervention at that point.  I discussed these findings and plan of care with the patient who is in agreement although somewhat frustrated about her need to wait and her level of pain at this time.  Patient is understanding of the course and is aware of reasons to seek immediate medical care including severe uncontrolled pain at home, syncope, vaginal bleeding, intractable nausea and vomiting.  Appears otherwise appropriate for discharge at this time.        Arthor Captain, PA-C 12/12/19 1842    Sabas Sous, MD 12/14/19 2322

## 2019-12-13 LAB — GC/CHLAMYDIA PROBE AMP (~~LOC~~) NOT AT ARMC
Chlamydia: NEGATIVE
Neisseria Gonorrhea: NEGATIVE

## 2020-01-23 ENCOUNTER — Ambulatory Visit (HOSPITAL_COMMUNITY): Payer: BC Managed Care – PPO

## 2020-01-24 ENCOUNTER — Ambulatory Visit (HOSPITAL_COMMUNITY): Admission: RE | Admit: 2020-01-24 | Payer: BC Managed Care – PPO | Source: Ambulatory Visit

## 2020-01-29 ENCOUNTER — Ambulatory Visit: Payer: BC Managed Care – PPO | Admitting: Family Medicine

## 2020-06-20 IMAGING — CT CT ABD-PELV W/ CM
2 of 4 series · 16 of 46 positions shown, 18 images · IV contrast (APPLIED)
Comparison: None.

CLINICAL DATA: Lower abdominal pain, nausea and diarrhea beginning
last night.

EXAM:
CT ABDOMEN AND PELVIS WITH CONTRAST
TECHNIQUE: Multidetector CT imaging of the abdomen and pelvis was performed
using the standard protocol following bolus administration of
intravenous contrast.
CONTRAST:  100mL 5QZBKS-IVV IOPAMIDOL (5QZBKS-IVV) INJECTION 61%

[Series 2: axial st · axial · 0.83mm/px · z∈[-436,-32]mm · 13 of 89 slices shown, 15 images]
[im 4/89  soft-tissue]
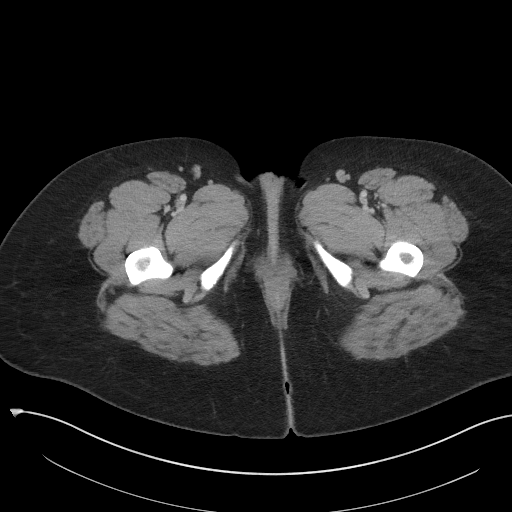
[im 4/89  bone]
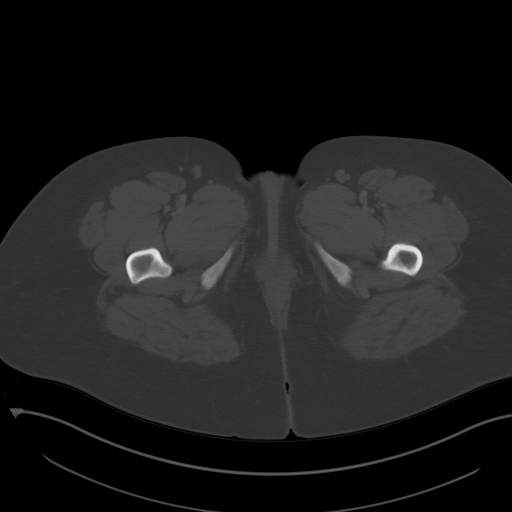
[im 11/89  soft-tissue]
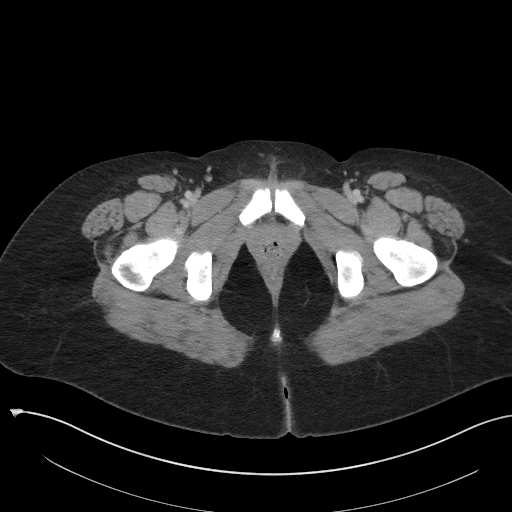
[im 18/89  soft-tissue]
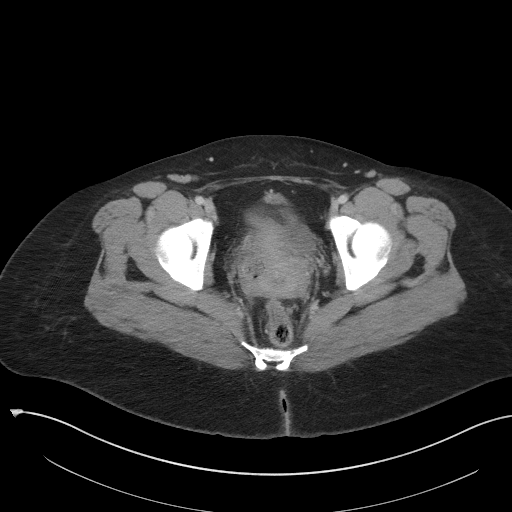
[im 25/89  soft-tissue]
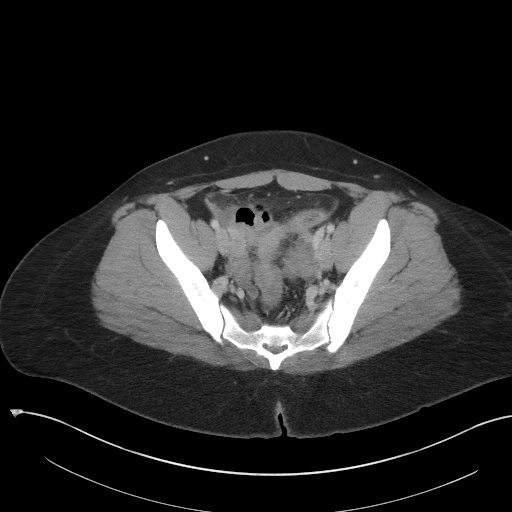
[im 32/89  soft-tissue]
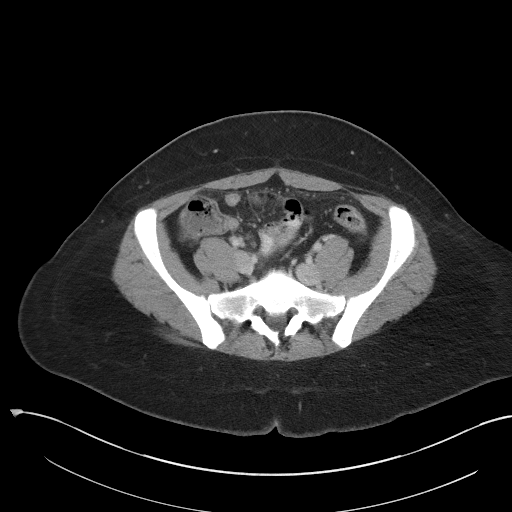
[im 39/89  soft-tissue]
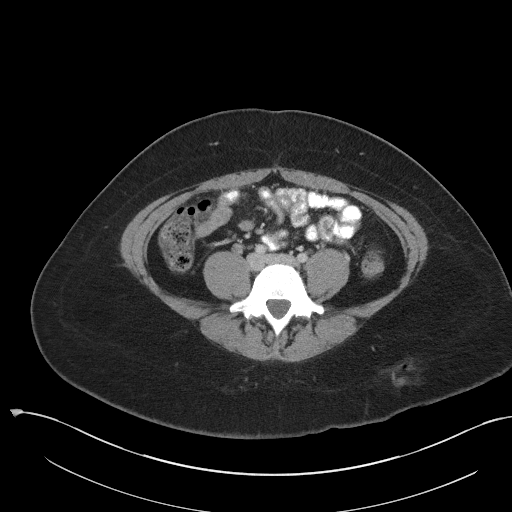
[im 46/89  soft-tissue]
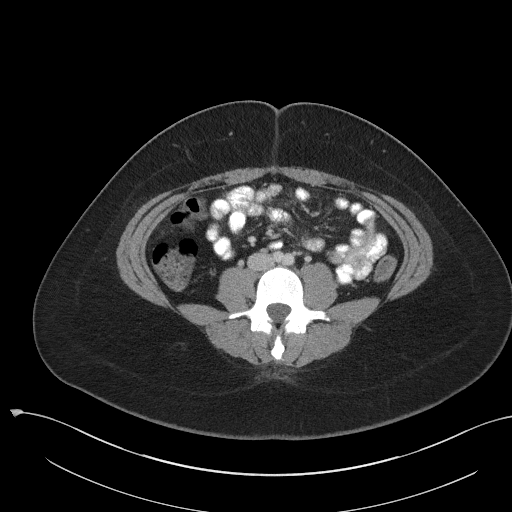
[im 50/89  soft-tissue]
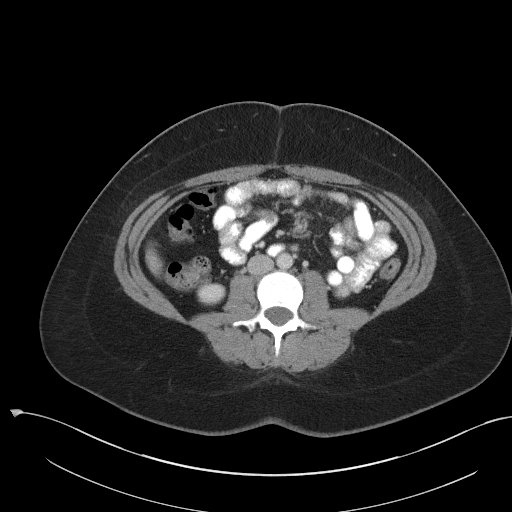
[im 57/89  soft-tissue]
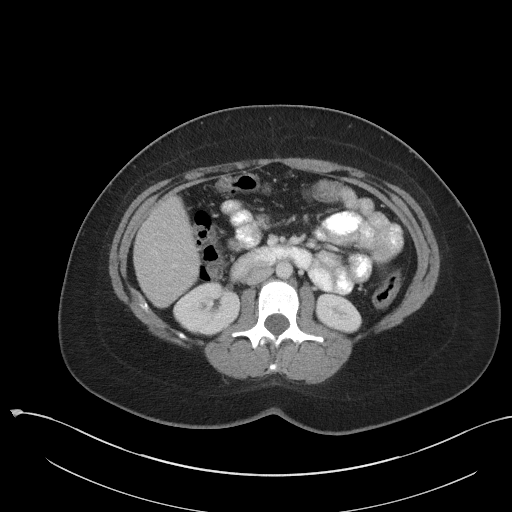
[im 57/89  bone]
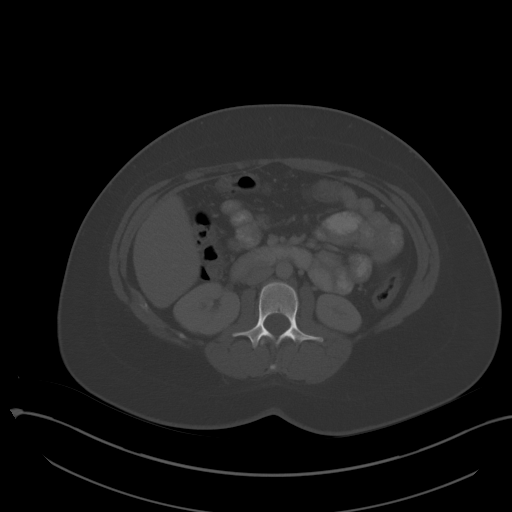
[im 64/89  soft-tissue]
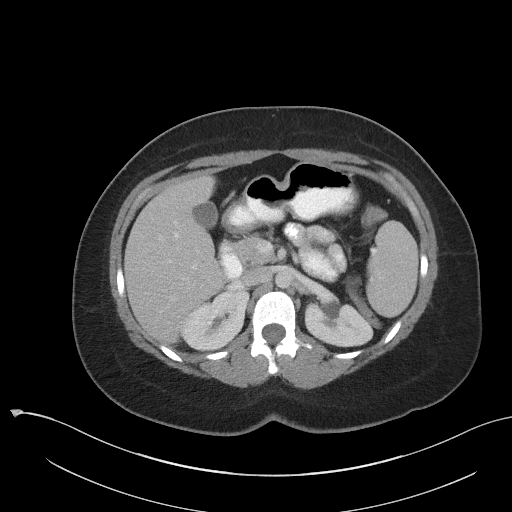
[im 71/89  soft-tissue]
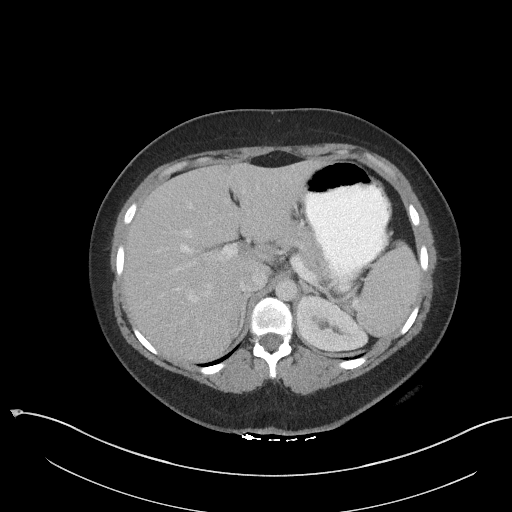
[im 78/89  soft-tissue]
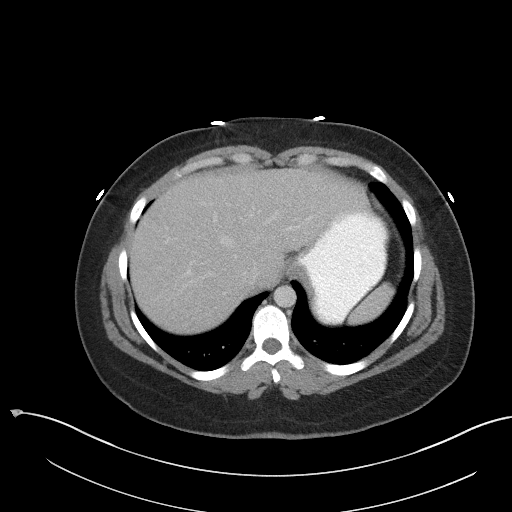
[im 85/89  soft-tissue]
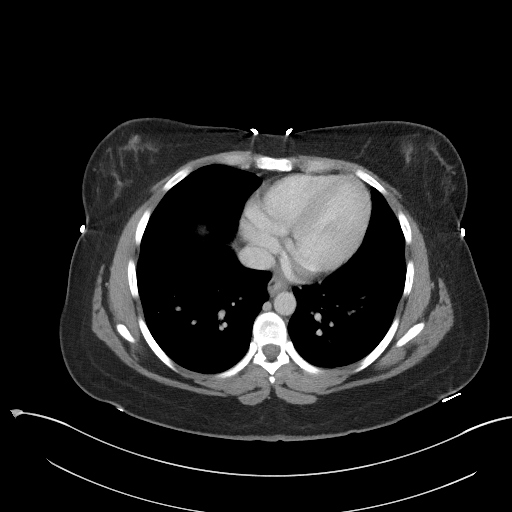

[Series 5: coronal st · coronal · 0.72mm/px · 3 of 92 slices shown]
[im 31/92  soft-tissue]
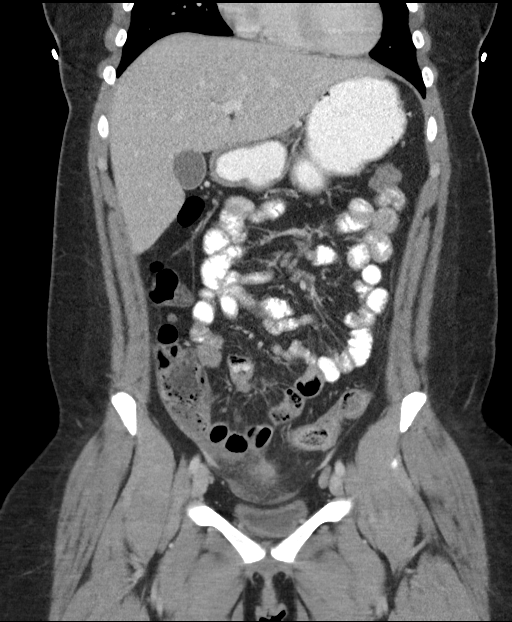
[im 41/92  soft-tissue]
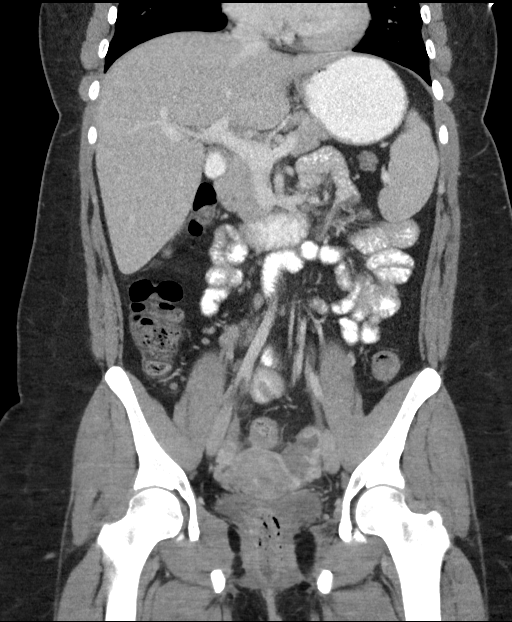
[im 51/92  soft-tissue]
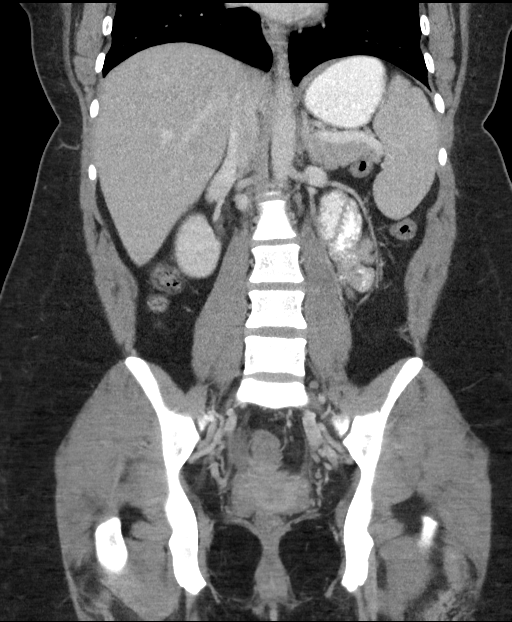

[16 of 46 positions shown; findings below may reference images not displayed]

FINDINGS: LOWER CHEST: Lung bases are clear. Included heart size is normal. No
pericardial effusion.

HEPATOBILIARY: Liver and gallbladder are normal. Mild focal fatty
infiltration about the falciform ligament.

PANCREAS: Normal.

SPLEEN: Normal.

ADRENALS/URINARY TRACT: Kidneys are orthotopic, demonstrating
symmetric enhancement. No nephrolithiasis, hydronephrosis or solid
renal masses. The unopacified ureters are normal in course and
caliber. Urinary bladder is partially distended and unremarkable.
Normal adrenal glands.

STOMACH/BOWEL: The stomach, small and large bowel are normal in
course and caliber without inflammatory changes.

Appendix: Location: RIGHT lower quadrant

Diameter: 9 mm

Appendicolith: Not present

Mucosal hyper-enhancement: Not present

Extraluminal gas: Not present

Periappendiceal collection: Not present

VASCULAR/LYMPHATIC: Aortoiliac vessels are normal in course and
caliber. No lymphadenopathy by CT size criteria.

REPRODUCTIVE: Normal.

OTHER: Trace free fluid in anterior pelvic soft tissues with
inflammatory changes about the RIGHT adnexa, discrete from the
appendix.

MUSCULOSKELETAL: Minimal subcutaneous gas and fat stranding LEFT
gluteal subcutaneous fat, potential injection site.
IMPRESSION: 1. Enlarged appendix without secondary findings of acute
appendicitis.
2. Inflammatory changes in the RIGHT lower quadrant, findings
concerning for PID or other gynecologic etiology. Consider pelvic
ultrasound.
3. Acute findings discussed with and reconfirmed by AIKUINEN SCHILDT on
09/06/2018 at [DATE].

## 2021-09-25 IMAGING — US US PELVIS COMPLETE TRANSABD/TRANSVAG W DUPLEX
1 series · 13 of 25 positions shown · non-contrast
Comparison: 09/06/2018, 12/12/2019

CLINICAL DATA: Right lower quadrant pain, nausea and vomiting,
abnormal CT

EXAM:
TRANSABDOMINAL AND TRANSVAGINAL ULTRASOUND OF PELVIS
DOPPLER ULTRASOUND OF OVARIES
TECHNIQUE: Both transabdominal and transvaginal ultrasound examinations of the
pelvis were performed. Transabdominal technique was performed for
global imaging of the pelvis including uterus, ovaries, adnexal
regions, and pelvic cul-de-sac.
It was necessary to proceed with endovaginal exam following the
transabdominal exam to visualize the adnexal structures. Color and
duplex Doppler ultrasound was utilized to evaluate blood flow to the
ovaries.

[Series 1: us pelvis complete transabd/transvag w duplex · 112 acquisitions, 13 frames shown]
[im 1/112]
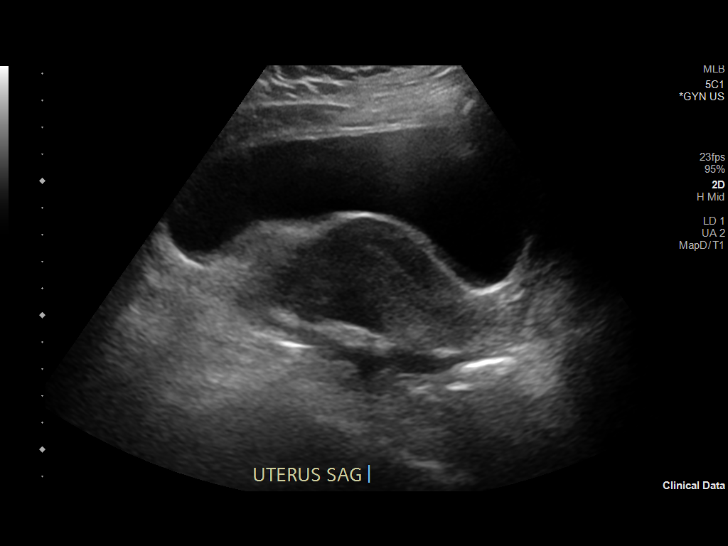
[im 10/112]
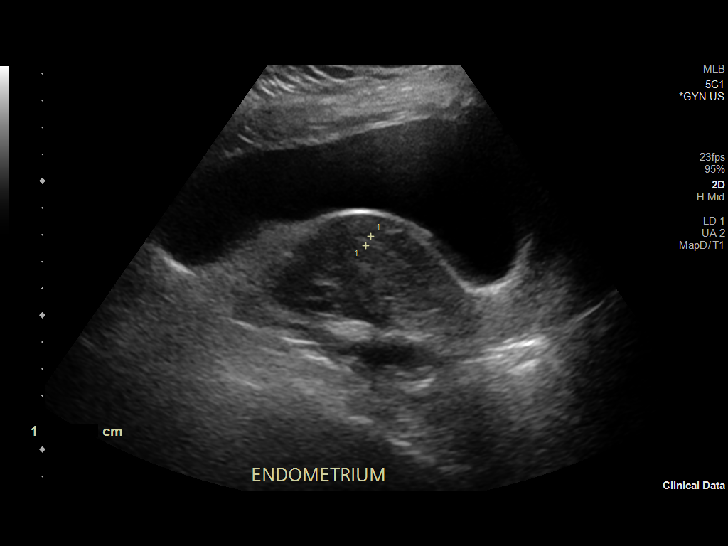
[im 19/112]
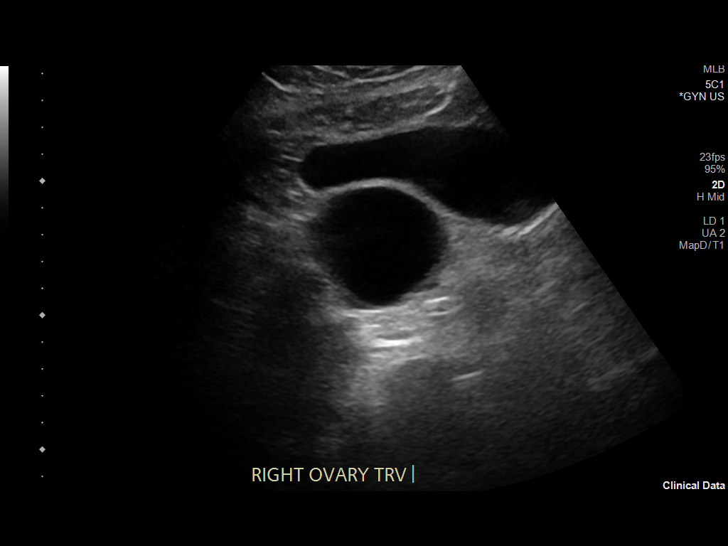
[im 28/112]
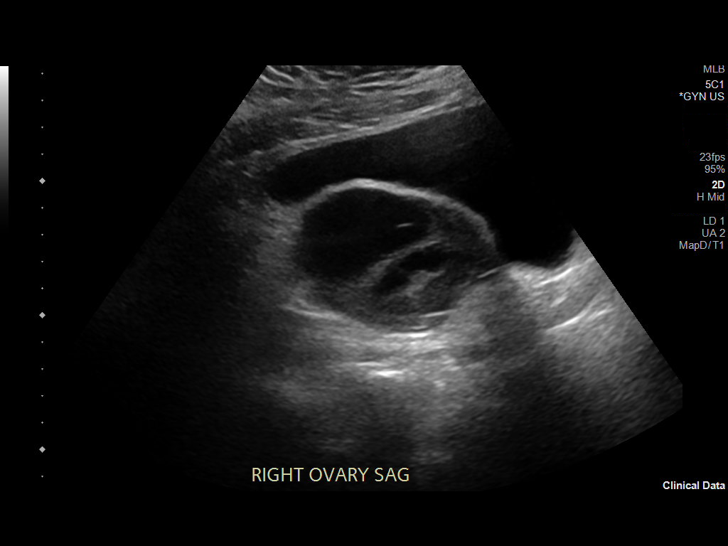
[im 38/112]
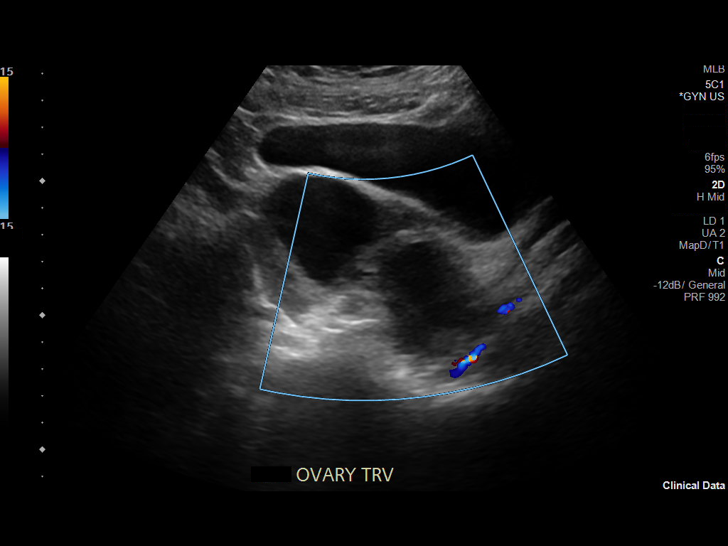
[im 47/112]
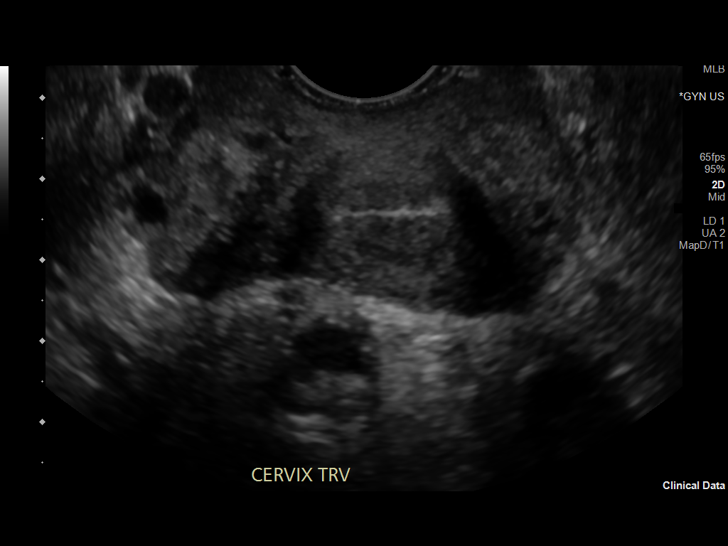
[im 56/112]
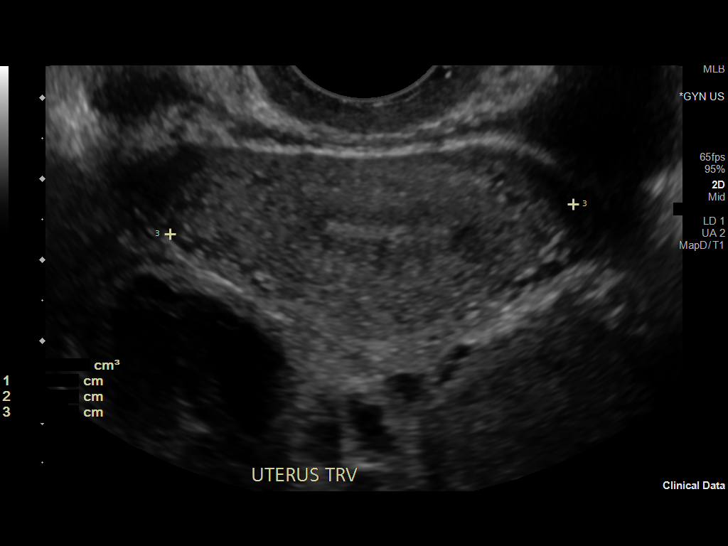
[im 65/112]
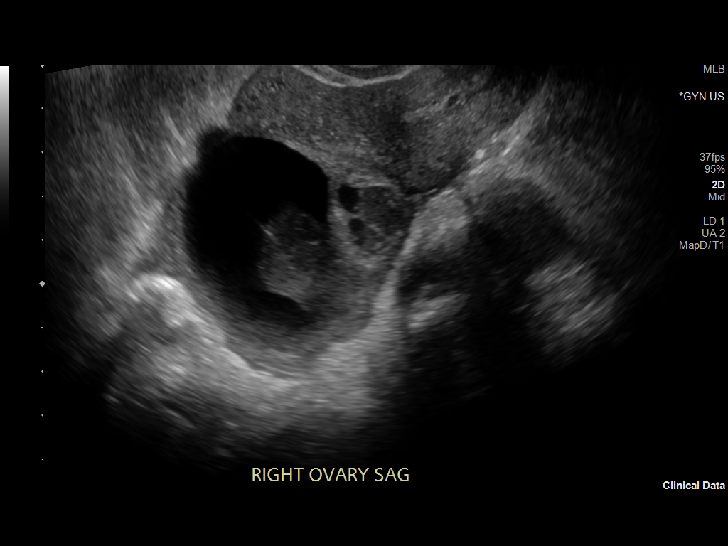
[im 75/112]
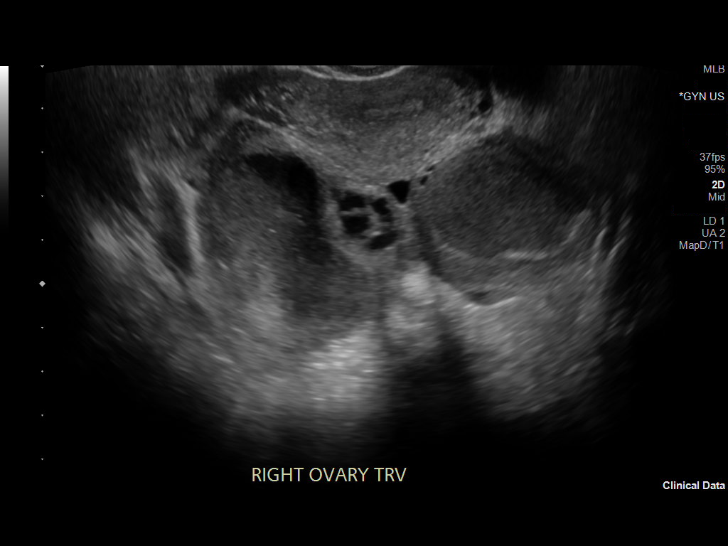
[im 84/112]
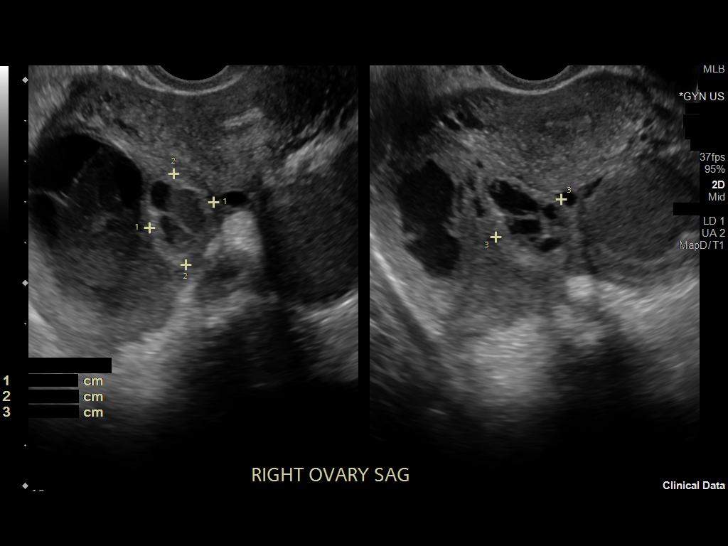
[im 93/112]
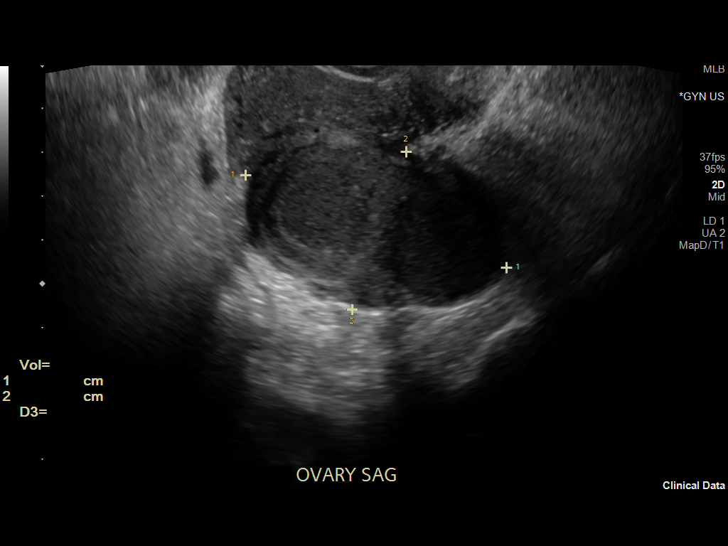
[im 102/112]
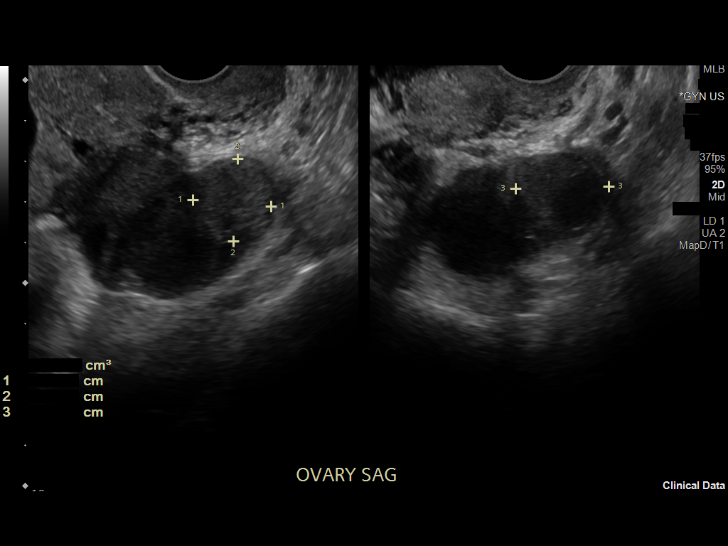
[im 112/112]
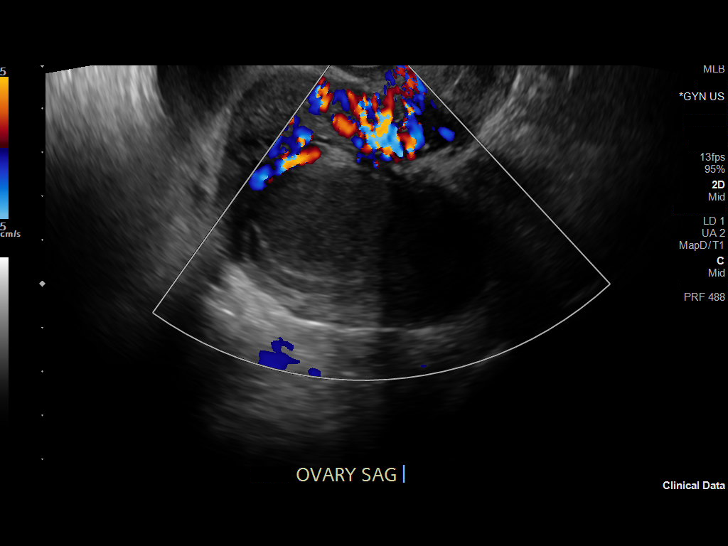

[13 of 25 positions shown; findings below may reference images not displayed]

FINDINGS: Uterus

Measurements: 8.1 x 3.5 x 6.1 cm = volume: 90 mL. No fibroids or
other mass visualized.

Endometrium

Thickness: 6 mm.  No focal abnormality visualized.

Right ovary

Measurements: 5.7 x 5.8 x 5.2 cm = volume: 90 mL. There are numerous
complex cysts within the right ovary, largest measuring 5.1 x 4.1 x
3.7 cm with thick septation. A 3.4 x 2.9 x 2.6 cm complex cyst
demonstrates hematocrit level consistent with hemorrhagic cyst.

Left ovary

Measurements: 6.3 x 3.8 x 4.6 cm = volume: 58 mL. Numerous complex
cysts are seen within the left ovary, with homogeneous internal
debris, largest measuring 4.4 x 2.5 x 3.6 cm

Pulsed Doppler evaluation of both ovaries demonstrates normal
low-resistance arterial and venous waveforms.

Other findings

No abnormal free fluid.
IMPRESSION: 1. Bilateral complex ovarian cysts, likely benign. Short-interval
follow up ultrasound in 6-12 weeks is recommended, preferably during
the week following the patient's normal menses.
2. Otherwise unremarkable pelvic ultrasound.

## 2022-06-30 ENCOUNTER — Other Ambulatory Visit (HOSPITAL_BASED_OUTPATIENT_CLINIC_OR_DEPARTMENT_OTHER): Payer: Self-pay

## 2022-06-30 MED ORDER — WEGOVY 0.25 MG/0.5ML ~~LOC~~ SOAJ
SUBCUTANEOUS | 1 refills | Status: AC
  Filled 2022-06-30: qty 2, 28d supply, fill #0
  Filled 2022-07-27: qty 2, 28d supply, fill #1

## 2022-07-02 ENCOUNTER — Encounter (HOSPITAL_BASED_OUTPATIENT_CLINIC_OR_DEPARTMENT_OTHER): Payer: Self-pay

## 2022-07-02 ENCOUNTER — Other Ambulatory Visit (HOSPITAL_BASED_OUTPATIENT_CLINIC_OR_DEPARTMENT_OTHER): Payer: Self-pay

## 2022-07-03 ENCOUNTER — Other Ambulatory Visit (HOSPITAL_BASED_OUTPATIENT_CLINIC_OR_DEPARTMENT_OTHER): Payer: Self-pay

## 2022-07-27 ENCOUNTER — Other Ambulatory Visit (HOSPITAL_BASED_OUTPATIENT_CLINIC_OR_DEPARTMENT_OTHER): Payer: Self-pay

## 2022-07-28 ENCOUNTER — Other Ambulatory Visit (HOSPITAL_BASED_OUTPATIENT_CLINIC_OR_DEPARTMENT_OTHER): Payer: Self-pay

## 2022-07-28 MED ORDER — WEGOVY 0.5 MG/0.5ML ~~LOC~~ SOAJ
SUBCUTANEOUS | 0 refills | Status: AC
  Filled 2022-07-28: qty 2, 28d supply, fill #0

## 2022-07-30 ENCOUNTER — Other Ambulatory Visit (HOSPITAL_BASED_OUTPATIENT_CLINIC_OR_DEPARTMENT_OTHER): Payer: Self-pay

## 2022-08-05 ENCOUNTER — Other Ambulatory Visit (HOSPITAL_BASED_OUTPATIENT_CLINIC_OR_DEPARTMENT_OTHER): Payer: Self-pay

## 2022-08-10 ENCOUNTER — Other Ambulatory Visit (HOSPITAL_BASED_OUTPATIENT_CLINIC_OR_DEPARTMENT_OTHER): Payer: Self-pay

## 2022-08-21 ENCOUNTER — Other Ambulatory Visit (HOSPITAL_BASED_OUTPATIENT_CLINIC_OR_DEPARTMENT_OTHER): Payer: Self-pay

## 2022-08-24 ENCOUNTER — Other Ambulatory Visit (HOSPITAL_BASED_OUTPATIENT_CLINIC_OR_DEPARTMENT_OTHER): Payer: Self-pay

## 2022-08-25 ENCOUNTER — Other Ambulatory Visit (HOSPITAL_BASED_OUTPATIENT_CLINIC_OR_DEPARTMENT_OTHER): Payer: Self-pay
